# Patient Record
Sex: Female | Born: 1955 | Race: Black or African American | Hispanic: No | Marital: Married
Health system: Southern US, Community
[De-identification: ages and names within clinical notes are randomized; demographics above are authoritative.]

---

## 2021-04-28 ENCOUNTER — Emergency Department (HOSPITAL_COMMUNITY)
Admission: EM | Admit: 2021-04-28 | Discharge: 2021-04-28 | Disposition: A | Discharge status: Home / Self Care | Payer: Managed Care, Other (non HMO) | Attending: Emergency Medicine | Admitting: Emergency Medicine

## 2021-04-28 ENCOUNTER — Encounter (HOSPITAL_COMMUNITY): Payer: Self-pay | Admitting: Emergency Medicine

## 2021-04-28 ENCOUNTER — Other Ambulatory Visit: Payer: Self-pay

## 2021-04-28 ENCOUNTER — Emergency Department (HOSPITAL_COMMUNITY): Payer: Managed Care, Other (non HMO)

## 2021-04-28 DIAGNOSIS — Y92410 Unspecified street and highway as the place of occurrence of the external cause: Secondary | ICD-10-CM | POA: Diagnosis not present

## 2021-04-28 DIAGNOSIS — S46811A Strain of other muscles, fascia and tendons at shoulder and upper arm level, right arm, initial encounter: Secondary | ICD-10-CM | POA: Insufficient documentation

## 2021-04-28 DIAGNOSIS — R519 Headache, unspecified: Secondary | ICD-10-CM | POA: Insufficient documentation

## 2021-04-28 DIAGNOSIS — M542 Cervicalgia: Secondary | ICD-10-CM | POA: Insufficient documentation

## 2021-04-28 DIAGNOSIS — S4991XA Unspecified injury of right shoulder and upper arm, initial encounter: Secondary | ICD-10-CM | POA: Diagnosis present

## 2021-04-28 MED ORDER — ACETAMINOPHEN 325 MG PO TABS
650.0000 mg | ORAL_TABLET | Freq: Once | ORAL | Status: AC
  Administered 2021-04-28: 650 mg via ORAL
  Filled 2021-04-28: qty 2
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
## 2021-07-20 ENCOUNTER — Emergency Department (HOSPITAL_COMMUNITY)
Admission: EM | Admit: 2021-07-20 | Discharge: 2021-07-20 | Disposition: A | Discharge status: Home / Self Care | Payer: Managed Care, Other (non HMO) | Attending: Emergency Medicine | Admitting: Emergency Medicine

## 2021-07-20 ENCOUNTER — Other Ambulatory Visit: Payer: Self-pay

## 2021-07-20 ENCOUNTER — Encounter (HOSPITAL_COMMUNITY): Payer: Self-pay | Admitting: Emergency Medicine

## 2021-07-20 DIAGNOSIS — K130 Diseases of lips: Secondary | ICD-10-CM | POA: Diagnosis not present

## 2021-07-20 MED ORDER — VALACYCLOVIR HCL 1 G PO TABS: 2000.0000 mg | ORAL_TABLET | Freq: Two times a day (BID) | ORAL | 0 refills | Status: AC

## 2021-07-20 MED ORDER — DOCOSANOL 10 % EX CREA: TOPICAL_CREAM | CUTANEOUS | 0 refills | Status: AC

## 2021-07-20 NOTE — Discharge Instructions (Addendum)
Take valacyclovir as directed   Use abreva as directed  Please follow up with your primary care provider within 5-7 days for re-evaluation of your symptoms. If you do not have a primary care provider, information for a healthcare clinic has been provided for you to make arrangements for follow up care. Please return to the emergency department for any new or worsening symptoms.

## 2021-07-20 NOTE — ED Provider Notes (Signed)
[facility]-EMERGENCY DEPT Provider Note   CSN: [id number] Arrival date & time: 07/20/21  1241     History Chief Complaint  Patient presents with   Facial Swelling    Kristen Black is a 65 y.o. female.  HPI  65 year old female presenting for evaluation of lip swelling for the last few days. She is new lipstick and then later developed swelling to the lip and some tingling/itching sensation. She states she scratch that with her finger now she has a wound to the area. There been no systemic symptoms. She denies any other environmental changes. She is not on any medications.  History reviewed. No pertinent past medical history.  There are no problems to display for this patient.   History reviewed. No pertinent surgical history.   OB History   No obstetric history on file.     No family history on file.     Home Medications Prior to Admission medications   Medication Sig Start Date End Date Taking? Authorizing Provider  Docosanol (ABREVA) 10 % CREA Apply to the lip 5 times daily until lesion is gone 07/20/21  Yes Osmara Drummonds S, PA-C  valACYclovir (VALTREX) 1000 MG tablet Take 2 tablets (2,000 mg total) by mouth 2 (two) times daily for 1 day. 07/20/21 07/21/21 Yes Blanchard Willhite S, PA-C    Allergies    Patient has no known allergies.  Review of Systems   Review of Systems  Constitutional:  Negative for fever.  HENT:         Lip swelling  Skin:  Positive for wound.   Physical Exam Updated Vital Signs BP (!) 153/91   Pulse 80   Temp 98.1 F (36.7 C) (Oral)   Resp 18   SpO2 100%   Physical Exam Vitals and nursing note reviewed.  Constitutional:      General: She is not in acute distress.    Appearance: She is well-developed.  HENT:     Head: Normocephalic and atraumatic.     Mouth/Throat:     Comments: Vesicular lesion noted to the upper lip with some associated swelling Eyes:     Conjunctiva/sclera: Conjunctivae normal.   Cardiovascular:     Rate and Rhythm: Normal rate.  Pulmonary:     Effort: Pulmonary effort is normal.  Musculoskeletal:        General: Normal range of motion.     Cervical back: Neck supple.  Skin:    General: Skin is warm and dry.  Neurological:     Mental Status: She is alert.       ED Results / Procedures / Treatments   Labs (all labs ordered are listed, but only abnormal results are displayed) Labs Reviewed - No data to display  EKG None  Radiology No results found.  Procedures Procedures   Medications Ordered in ED Medications - No data to display  ED Course  I have reviewed the triage vital signs and the nursing notes.  Pertinent labs & imaging results that were available during my care of the patient were reviewed by me and considered in my medical decision making (see chart for details).    MDM Rules/Calculators/A&P                          65 year old female presenting for evaluation of the lesion to the lip with some associated lip swelling. Has some tingling and itching as well. Exam seems most consistent with a cold  sore. We will give her abreva as well as an antiviral. Have advised close follow-up with her PCP instructor turn precautions. She voices understanding the plan a reason to return. All questions answered, ptstable for discharge.   Final Clinical Impression(s) / ED Diagnoses Final diagnoses:  Lip lesion    Rx / DC Orders ED Discharge Orders          Ordered    Docosanol (ABREVA) 10 % CREA        07/20/21 1744    valACYclovir (VALTREX) 1000 MG tablet  2 times daily        07/20/21 1744             Jaclynn Laumann, [place], PA-C 07/20/21 1745    Milagros Loll, MD 07/21/21 0001

## 2021-07-20 NOTE — ED Triage Notes (Signed)
Patient c/o lip swelling x3 days after using new lipstick. Denies throat swelling and difficulty breathing. Reports taking benadryl yesterday with some relief.
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
## 2022-11-16 ENCOUNTER — Other Ambulatory Visit: Payer: Self-pay

## 2022-11-16 ENCOUNTER — Emergency Department (HOSPITAL_COMMUNITY): Payer: Managed Care, Other (non HMO)

## 2022-11-16 ENCOUNTER — Emergency Department (HOSPITAL_COMMUNITY)
Admission: EM | Admit: 2022-11-16 | Discharge: 2022-11-16 | Discharge status: Against Medical Advice | Payer: Managed Care, Other (non HMO) | Attending: Medical | Admitting: Medical

## 2022-11-16 DIAGNOSIS — Z5321 Procedure and treatment not carried out due to patient leaving prior to being seen by health care provider: Secondary | ICD-10-CM | POA: Insufficient documentation

## 2022-11-16 DIAGNOSIS — R519 Headache, unspecified: Secondary | ICD-10-CM | POA: Diagnosis present

## 2022-11-16 DIAGNOSIS — H538 Other visual disturbances: Secondary | ICD-10-CM | POA: Diagnosis not present

## 2022-11-16 MED ORDER — DEXAMETHASONE SODIUM PHOSPHATE 10 MG/ML IJ SOLN: 8.0000 mg | Freq: Once | INTRAMUSCULAR | Status: DC

## 2022-11-16 MED ORDER — SODIUM CHLORIDE 0.9 % IV BOLUS: 1000.0000 mL | Freq: Once | INTRAVENOUS | Status: DC
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
## 2022-11-16 NOTE — ED Provider Triage Note (Signed)
Emergency Medicine Provider Triage Evaluation Note  Kristen Black , a 66 y.o. female  was evaluated in triage.  Pt complains of severe sudden headache that started today. Associated R vision blurring and light sensitivity. No associated N/V.   Review of Systems  Positive: headache Negative: N/V  Physical Exam  BP (!) 147/99 (BP Location: Left Arm)   Pulse 88   Temp 98.9 F (37.2 C) (Oral)   Resp 18   Ht 5\' 6"  (1.676 m)   Wt 57.6 kg   SpO2 99%   BMI 20.50 kg/m  Gen:   Awake, no distress  Resp:  Normal effort  MSK:   Moves extremities without difficulty  Other:  No neurodeficits  Medical Decision Making  Medically screening exam initiated at 8:10 PM.  Appropriate orders placed.  Kristen Black was informed that the remainder of the evaluation will be completed by another provider, this initial triage assessment does not replace that evaluation, and the importance of remaining in the ED until their evaluation is complete.     Kristen Black, Kristen Black 11/16/22 2015

## 2023-08-18 ENCOUNTER — Telehealth (HOSPITAL_COMMUNITY): Payer: Self-pay

## 2023-08-18 NOTE — Telephone Encounter (Signed)
Received cardiac rehab referral for pt from [facility]. Called pt to see if she was interested, pt stated that she was going to call back that she is not sure if she would like to participate. I advised pt to let us know within 2 weeks pt understood. Will place pt ppw in the letter bin.

## 2023-09-01 ENCOUNTER — Telehealth (HOSPITAL_COMMUNITY): Payer: Self-pay

## 2023-09-01 NOTE — Telephone Encounter (Signed)
No response from pt.  Closed referral
